# Patient Record
Sex: Female | Born: 2005
Health system: Southern US, Community
[De-identification: ages and names within clinical notes are randomized; demographics above are authoritative.]

---
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
## 2023-06-18 ENCOUNTER — Other Ambulatory Visit (HOSPITAL_COMMUNITY)
Admission: RE | Admit: 2023-06-18 | Discharge: 2023-06-18 | Disposition: A | Discharge status: Home / Self Care | Payer: Medicaid Other | Source: Ambulatory Visit

## 2023-06-18 ENCOUNTER — Ambulatory Visit (INDEPENDENT_AMBULATORY_CARE_PROVIDER_SITE_OTHER): Payer: BC Managed Care – PPO

## 2023-06-18 VITALS — BP 107/64 | HR 67 | Ht 69.0 in | Wt 129.0 lb

## 2023-06-18 DIAGNOSIS — Z113 Encounter for screening for infections with a predominantly sexual mode of transmission: Secondary | ICD-10-CM | POA: Diagnosis present

## 2023-06-18 DIAGNOSIS — A749 Chlamydial infection, unspecified: Secondary | ICD-10-CM

## 2023-06-18 DIAGNOSIS — Z30430 Encounter for insertion of intrauterine contraceptive device: Secondary | ICD-10-CM

## 2023-06-18 DIAGNOSIS — Z3202 Encounter for pregnancy test, result negative: Secondary | ICD-10-CM | POA: Diagnosis not present

## 2023-06-18 MED ADMIN — Levonorgestrel IUD 20 MCG/DAY (Initial) (52 MG Total): 1 | INTRAUTERINE | NDC 50419042301

## 2023-06-19 ENCOUNTER — Encounter (HOSPITAL_BASED_OUTPATIENT_CLINIC_OR_DEPARTMENT_OTHER): Payer: Self-pay | Admitting: Urology

## 2023-06-19 ENCOUNTER — Other Ambulatory Visit: Payer: Self-pay

## 2023-06-19 ENCOUNTER — Telehealth: Payer: Self-pay

## 2023-06-19 MED ORDER — IBUPROFEN 600 MG PO TABS: 600.0000 mg | ORAL_TABLET | Freq: Four times a day (QID) | ORAL | 0 refills | Status: DC | PRN

## 2023-06-19 NOTE — Patient Instructions (Signed)

## 2023-06-19 NOTE — Telephone Encounter (Signed)
Spoke with patients mother to check in on patient after IUD placement on 06/18/2023. Patients mother reports patient is doing well.

## 2023-06-20 ENCOUNTER — Ambulatory Visit: Payer: BC Managed Care – PPO

## 2023-06-20 ENCOUNTER — Telehealth: Payer: Self-pay

## 2023-06-20 LAB — CERVICOVAGINAL ANCILLARY ONLY
Chlamydia: POSITIVE — AB
Comment: NEGATIVE
Comment: NEGATIVE
Comment: NORMAL
Neisseria Gonorrhea: NEGATIVE
Trichomonas: NEGATIVE

## 2023-06-20 MED ORDER — DOXYCYCLINE HYCLATE 100 MG PO TABS: 100.0000 mg | ORAL_TABLET | Freq: Two times a day (BID) | ORAL | 0 refills | Status: DC

## 2023-06-20 NOTE — Addendum Note (Signed)
Addended by: Gerrit Heck L on: 06/20/2023 04:25 PM   Modules accepted: Orders

## 2023-06-20 NOTE — Telephone Encounter (Signed)
I connected with  Rhonda Morris on 06/20/23 by telephone and verified that I am speaking with the correct person using two identifiers.   Patient informed of positive Chlamydia results noted on cervicovaginal ancillary swab. Patient informed medication has been sent to pharmacy for pickup. Patient advised to inform partner(s) so partner(s) can be tested and treated at PCP or the health department. Patient advised to abstain from any intercourse until all parties have received appropriate treatment. Patient encouraged to come into the office for serum STD panel. Appt. Scheduled for Monday 07/01/2023 at 10:30am. Patient informed a test of cure can be complete on 11/26 at string check appt. Patient has no further questions at this time.

## 2023-06-20 NOTE — Telephone Encounter (Signed)
-----   Message from Cherre Robins sent at 06/20/2023  4:25 PM EDT ----- Attempted to call patient. No answer, voicemail nondescript so no message left. Please call patient tomorrow and inform of results.  Treatment sent to pharmacy on file.

## 2023-06-24 ENCOUNTER — Other Ambulatory Visit (INDEPENDENT_AMBULATORY_CARE_PROVIDER_SITE_OTHER): Payer: BC Managed Care – PPO

## 2023-06-24 DIAGNOSIS — Z113 Encounter for screening for infections with a predominantly sexual mode of transmission: Secondary | ICD-10-CM | POA: Diagnosis not present

## 2023-06-24 NOTE — Progress Notes (Signed)
Patient here STI serum screening. Lab slip given to patient.

## 2023-06-25 LAB — HIV ANTIBODY (ROUTINE TESTING W REFLEX): HIV Screen 4th Generation wRfx: NONREACTIVE

## 2023-06-25 LAB — HEPATITIS B SURFACE ANTIGEN: Hepatitis B Surface Ag: NEGATIVE

## 2023-06-25 LAB — HEPATITIS C ANTIBODY: Hep C Virus Ab: NONREACTIVE

## 2023-06-25 LAB — RPR: RPR Ser Ql: NONREACTIVE

## 2023-08-06 ENCOUNTER — Ambulatory Visit: Payer: Medicaid Other

## 2023-08-20 ENCOUNTER — Other Ambulatory Visit (HOSPITAL_COMMUNITY)
Admission: RE | Admit: 2023-08-20 | Discharge: 2023-08-20 | Disposition: A | Discharge status: Home / Self Care | Payer: BC Managed Care – PPO | Source: Ambulatory Visit

## 2023-08-20 ENCOUNTER — Ambulatory Visit (INDEPENDENT_AMBULATORY_CARE_PROVIDER_SITE_OTHER): Payer: BC Managed Care – PPO

## 2023-08-20 VITALS — BP 128/84 | HR 86 | Ht 69.0 in | Wt 131.0 lb

## 2023-08-20 DIAGNOSIS — Z30431 Encounter for routine checking of intrauterine contraceptive device: Secondary | ICD-10-CM

## 2023-08-20 DIAGNOSIS — A749 Chlamydial infection, unspecified: Secondary | ICD-10-CM | POA: Insufficient documentation

## 2023-08-20 DIAGNOSIS — Z113 Encounter for screening for infections with a predominantly sexual mode of transmission: Secondary | ICD-10-CM

## 2023-08-20 DIAGNOSIS — N898 Other specified noninflammatory disorders of vagina: Secondary | ICD-10-CM | POA: Diagnosis not present

## 2023-08-20 NOTE — Progress Notes (Signed)
    GYNECOLOGY OFFICE ENCOUNTER NOTE-STRING CHECK  History:  Rhonda Morris is a 16 y.o.  here today for today for IUD string check; Mirena  IUD was placed  06/18/2023. No complaints about the IUD, no concerning side effects. She reports having menstrual cycle that lasted 5 days, but cramping has improved. She also requires TOC for CT that was positive.  She reports partner was treated as well.   The following portions of the patient's history were reviewed and updated as appropriate: allergies, current medications, past family history, past medical history, past social history, past surgical history and problem list. No pap smear on file d/t age.  Review of Systems:  Pertinent items are noted in HPI.   Objective:  Physical Exam Blood pressure 128/84, pulse 86, height 5\' 9"  (1.753 m), weight 131 lb (59.4 kg). CONSTITUTIONAL: Well-developed, well-nourished female in no acute distress.  NEUROLOGIC: Alert and oriented to person, place, and time. Normal reflexes, muscle tone coordination.  PSYCHIATRIC: Normal mood and affect. Normal behavior. Normal judgment and thought content. CARDIOVASCULAR: Normal heart rate noted RESPIRATORY: Effort and breath sounds normal, no problems with respiration noted ABDOMEN: Soft, no distention noted.   PELVIC: Normal appearing external genitalia; normal appearing vaginal mucosa and cervix.  IUD strings visualized, about 2 cm in length outside cervix. Done in the presence of a chaperone-Lindsey, RN.   Assessment & Plan:  String Check TOC  -Reassured that strings noted. -Discussed checking with heavy bleeding or painful cramping. -TOC collected. Will add on yeast and BV as patient with copious amt yellowish discharge. -RTO prn  Cherre Robins MSN, CNM Advanced Practice Provider, [facility]

## 2023-08-22 LAB — CERVICOVAGINAL ANCILLARY ONLY
Bacterial Vaginitis (gardnerella): NEGATIVE
Candida Glabrata: NEGATIVE
Candida Vaginitis: NEGATIVE
Chlamydia: POSITIVE — AB
Comment: NEGATIVE
Comment: NEGATIVE
Comment: NEGATIVE
Comment: NEGATIVE
Comment: NEGATIVE
Comment: NORMAL
Neisseria Gonorrhea: POSITIVE — AB
Trichomonas: NEGATIVE

## 2023-08-23 ENCOUNTER — Telehealth: Payer: Self-pay

## 2023-08-23 MED ORDER — DOXYCYCLINE HYCLATE 100 MG PO TABS: 100.0000 mg | ORAL_TABLET | Freq: Two times a day (BID) | ORAL | 0 refills | Status: DC

## 2023-08-23 NOTE — Addendum Note (Signed)
Addended by: Gerrit Heck L on: 08/23/2023 10:22 AM   Modules accepted: Orders

## 2023-08-23 NOTE — Telephone Encounter (Signed)
-----   Message from Cherre Robins sent at 08/23/2023 10:22 AM EST ----- Please schedule patient for treatment/injection. Rx sent to pharmacy.

## 2023-08-23 NOTE — Telephone Encounter (Signed)
Called patient to inform her that she tested positive for Gonorrhea and Chlamydia and to schedule her for a Rocephin injection. Left message for patient to call the office back. Sheera Illingworth l Kaiden Dardis, CMA

## 2023-08-30 ENCOUNTER — Ambulatory Visit (INDEPENDENT_AMBULATORY_CARE_PROVIDER_SITE_OTHER): Payer: BC Managed Care – PPO

## 2023-08-30 VITALS — BP 107/76 | HR 63 | Wt 133.0 lb

## 2023-08-30 DIAGNOSIS — A549 Gonococcal infection, unspecified: Secondary | ICD-10-CM

## 2023-08-30 MED ORDER — CEFTRIAXONE SODIUM 500 MG IJ SOLR
500.0000 mg | Freq: Once | INTRAMUSCULAR | Status: AC
  Administered 2023-08-30: 500 mg via INTRAMUSCULAR

## 2023-08-30 NOTE — Progress Notes (Signed)
Rhonda Morris here for Rocephin  Injection.  Injection administered without complication. Patient will return in  as needed  for next injection.  Rhonda Morris l Rhonda Morris, CMA 08/30/2023  9:56 AM

## 2023-10-08 DIAGNOSIS — R6883 Chills (without fever): Secondary | ICD-10-CM | POA: Diagnosis not present

## 2023-10-08 DIAGNOSIS — R059 Cough, unspecified: Secondary | ICD-10-CM | POA: Diagnosis not present

## 2023-10-08 DIAGNOSIS — U071 COVID-19: Secondary | ICD-10-CM | POA: Diagnosis not present

## 2024-02-06 ENCOUNTER — Other Ambulatory Visit (HOSPITAL_COMMUNITY)
Admission: RE | Admit: 2024-02-06 | Discharge: 2024-02-06 | Disposition: A | Discharge status: Home / Self Care | Source: Ambulatory Visit | Attending: Physician Assistant | Admitting: Physician Assistant

## 2024-02-06 ENCOUNTER — Encounter: Payer: Self-pay | Admitting: Physician Assistant

## 2024-02-06 ENCOUNTER — Ambulatory Visit (INDEPENDENT_AMBULATORY_CARE_PROVIDER_SITE_OTHER): Admitting: Physician Assistant

## 2024-02-06 VITALS — BP 104/65 | HR 75 | Temp 98.7°F | Resp 16 | Ht 69.0 in | Wt 144.0 lb

## 2024-02-06 DIAGNOSIS — K625 Hemorrhage of anus and rectum: Secondary | ICD-10-CM | POA: Diagnosis not present

## 2024-02-06 DIAGNOSIS — Z113 Encounter for screening for infections with a predominantly sexual mode of transmission: Secondary | ICD-10-CM | POA: Diagnosis not present

## 2024-02-06 DIAGNOSIS — H6123 Impacted cerumen, bilateral: Secondary | ICD-10-CM | POA: Diagnosis not present

## 2024-02-06 NOTE — Progress Notes (Unsigned)
 New patient visit   Patient: Rhonda Morris   DOB: [birth year]   18 y.o. Female  MRN: [id number] Visit Date: 02/06/2024  Today's healthcare provider: Trenton Frock, PA-C   Chief Complaint  Patient presents with   New Patient (Initial Visit)    Here to establish care   Subjective    Rhonda Morris is a 18 y.o. female who presents today as a new patient to establish care.   Ears, left ear more clogged, both blocked up .  2 years, blood in stool on toilet paper and in stool, sometimes lower abdominal pain and hurts return, was attriubted to consipation, once daily.    Eats fiber, miralax, tried stool softener ; no weight   Mirena , iud  No LMP recorded. (Menstrual status: IUD).  Random spotting;   History reviewed. No pertinent past medical history. History reviewed. No pertinent surgical history. Family Status  Relation Name Status   Mother  Alive   Father  Alive   Sister  Alive   Sister  Alive   Sister  Alive   Brother  Alive   Brother  Alive   MGM  Alive   MGF  Alive   PGM  Alive   PGF  Alive  No partnership data on file   Family History  Problem Relation Age of Onset   Hypertension Maternal Grandmother    Diabetes Maternal Grandmother    Social History   Socioeconomic History   Marital status: Single    Spouse name: Not on file   Number of children: Not on file   Years of education: Not on file   Highest education level: Not on file  Occupational History   Not on file  Tobacco Use   Smoking status: Never   Smokeless tobacco: Never  Vaping Use   Vaping status: Former  Substance and Sexual Activity   Alcohol use: Not Currently   Drug use: Not Currently   Sexual activity: Yes    Birth control/protection: I.U.D.  Other Topics Concern   Not on file  Social History Narrative   ** Merged History Encounter **       Social Drivers of Corporate investment banker Strain: Not on file  Food Insecurity: Not on file  Transportation Needs: Not on  file  Physical Activity: Not on file  Stress: Not on file  Social Connections: Not on file   Outpatient Medications Prior to Visit  Medication Sig   [DISCONTINUED] doxycycline  (VIBRA -TABS) 100 MG tablet Take 1 tablet (100 mg total) by mouth 2 (two) times daily.   [DISCONTINUED] fluconazole  (DIFLUCAN ) 150 MG tablet Take 1 tablet as needed for vaginal yeast infection. (Patient not taking: Reported on 08/30/2023)   [DISCONTINUED] ibuprofen  (ADVIL ) 600 MG tablet Take 1 tablet (600 mg total) by mouth every 6 (six) hours as needed for mild pain or moderate pain. (Patient not taking: Reported on 08/30/2023)   [DISCONTINUED] ondansetron  (ZOFRAN -ODT) 4 MG disintegrating tablet Take 1 tablet (4 mg total) by mouth every 8 (eight) hours as needed for nausea or vomiting. (Patient not taking: Reported on 08/30/2023)   No facility-administered medications prior to visit.   No Known Allergies  Immunization History  Administered Date(s) Administered   DTaP 06/06/2009   DTaP / Hep B / IPV 11/02/2004, 12/15/2004, 02/27/2005   HIB, Unspecified 11/02/2004, 12/15/2004, 03/07/2006   HPV 9-valent 05/31/2017, 07/25/2018   Hep B, Unspecified 2004-04-11, 02/27/2005   Hepatitis A, Ped/Adol-2 Dose 07/12/2008, 05/29/2010   IPV 06/06/2009  Influenza Nasal 06/06/2009, 05/29/2010   Influenza, Seasonal, Injecte, Preservative Fre 07/12/2008   Influenza,Quad,Nasal, Live 06/17/2014   MMR 06/06/2009, 05/29/2010   MenQuadfi_Meningococcal Groups ACYW Conjugate 12/08/2021   Meningococcal Conjugate 05/31/2017   Pneumococcal Conjugate PCV 7 11/02/2004, 12/15/2004, 02/27/2005   Tdap 05/31/2017   Varicella 06/06/2009, 05/29/2010    Health Maintenance  Topic Date Due   Meningococcal B Vaccine (1 of 2 - Standard) Never done   COVID-19 Vaccine (1 - 2024-25 season) Never done   INFLUENZA VACCINE  04/10/2024   CHLAMYDIA SCREENING  08/19/2024   DTaP/Tdap/Td (6 - Td or Tdap) 06/01/2027   HPV VACCINES  Completed   Hepatitis  C Screening  Completed   HIV Screening  Completed    Patient Care Team: Trenton Frock, PA-C as PCP - General (Physician Assistant)  Review of Systems  {Insert previous labs (optional):23779} {See past labs  Heme  Chem  Endocrine  Serology  Results Review (optional):1}   Objective    BP 104/65 (BP Location: Right Arm, Patient Position: Sitting, Cuff Size: Small)   Pulse 75   Temp 98.7 F (37.1 C) (Oral)   Resp 16   Ht 5\' 9"  (1.753 m)   Wt 144 lb (65.3 kg)   SpO2 98%   BMI 21.27 kg/m  {Insert last BP/Wt (optional):23777}{See vitals history (optional):1}   Physical Exam Constitutional:      General: She is awake.     Appearance: She is well-developed.  HENT:     Head: Normocephalic.     Right Ear: There is impacted cerumen.     Left Ear: There is impacted cerumen.     Ears:     Comments: Post flushing, both EAC clear with normal appearing TM Eyes:     Conjunctiva/sclera: Conjunctivae normal.  Cardiovascular:     Rate and Rhythm: Normal rate and regular rhythm.     Heart sounds: Normal heart sounds.  Pulmonary:     Effort: Pulmonary effort is normal.     Breath sounds: Normal breath sounds.  Abdominal:     Palpations: Abdomen is soft.     Tenderness: There is no abdominal tenderness. There is no guarding.  Skin:    General: Skin is warm.  Neurological:     Mental Status: She is alert and oriented to person, place, and time.  Psychiatric:        Attention and Perception: Attention normal.        Mood and Affect: Mood normal.        Speech: Speech normal.        Behavior: Behavior is cooperative.     Depression Screen    02/06/2024    3:28 PM  PHQ 2/9 Scores  PHQ - 2 Score 0   No results found for any visits on 02/06/24.  Assessment & Plan     Routine screening for STI (sexually transmitted infection) -     RPR -     HIV Antibody (routine testing w rflx) -     Hepatitis C antibody -     Cervicovaginal ancillary only  Rectal bleeding -      Ambulatory referral to Gastroenterology -     CBC with Differential/Platelet -     IBC + Ferritin -     C-reactive protein -     Sedimentation rate -     Comprehensive metabolic panel with GFR     No follow-ups on file.      Trenton Frock, PA-C  [facility] [phone] (phone) [phone] (fax)  [facility]

## 2024-02-07 ENCOUNTER — Encounter: Payer: Self-pay | Admitting: Physician Assistant

## 2024-02-07 DIAGNOSIS — K625 Hemorrhage of anus and rectum: Secondary | ICD-10-CM | POA: Insufficient documentation

## 2024-02-07 LAB — IBC + FERRITIN
Ferritin: 8.3 ng/mL — ABNORMAL LOW (ref 10.0–291.0)
Iron: 77 ug/dL (ref 42–145)
Saturation Ratios: 21.7 % (ref 20.0–50.0)
TIBC: 355.6 ug/dL (ref 250.0–450.0)
Transferrin: 254 mg/dL (ref 212.0–360.0)

## 2024-02-07 LAB — COMPREHENSIVE METABOLIC PANEL WITH GFR
ALT: 19 U/L (ref 0–35)
AST: 24 U/L (ref 0–37)
Albumin: 4.4 g/dL (ref 3.5–5.2)
Alkaline Phosphatase: 39 U/L — ABNORMAL LOW (ref 47–119)
BUN: 14 mg/dL (ref 6–23)
CO2: 29 meq/L (ref 19–32)
Calcium: 9.5 mg/dL (ref 8.4–10.5)
Chloride: 104 meq/L (ref 96–112)
Creatinine, Ser: 0.86 mg/dL (ref 0.40–1.20)
GFR: 97.87 mL/min (ref >60.00)
Glucose, Bld: 79 mg/dL (ref 70–99)
Potassium: 4.4 meq/L (ref 3.5–5.1)
Sodium: 139 meq/L (ref 135–145)
Total Bilirubin: 0.5 mg/dL (ref 0.2–1.2)
Total Protein: 7.2 g/dL (ref 6.0–8.3)

## 2024-02-07 LAB — HIV ANTIBODY (ROUTINE TESTING W REFLEX): HIV 1&2 Ab, 4th Generation: NONREACTIVE

## 2024-02-07 LAB — CBC WITH DIFFERENTIAL/PLATELET
Basophils Absolute: 0 10*3/uL (ref 0.0–0.1)
Basophils Relative: 0.9 % (ref 0.0–3.0)
Eosinophils Absolute: 0.1 10*3/uL (ref 0.0–0.7)
Eosinophils Relative: 1.9 % (ref 0.0–5.0)
HCT: 38.2 % (ref 36.0–49.0)
Hemoglobin: 12.8 g/dL (ref 12.0–16.0)
Lymphocytes Relative: 44.8 % (ref 24.0–48.0)
Lymphs Abs: 1.8 10*3/uL (ref 0.7–4.0)
MCHC: 33.5 g/dL (ref 31.0–37.0)
MCV: 94.9 fl (ref 78.0–98.0)
Monocytes Absolute: 0.4 10*3/uL (ref 0.1–1.0)
Monocytes Relative: 9.8 % (ref 3.0–12.0)
Neutro Abs: 1.7 10*3/uL (ref 1.4–7.7)
Neutrophils Relative %: 42.6 % — ABNORMAL LOW (ref 43.0–71.0)
Platelets: 272 10*3/uL (ref 150.0–575.0)
RBC: 4.03 Mil/uL (ref 3.80–5.70)
RDW: 13.3 % (ref 11.4–15.5)
WBC: 4.1 10*3/uL — ABNORMAL LOW (ref 4.5–13.5)

## 2024-02-07 LAB — C-REACTIVE PROTEIN: CRP: <1 mg/dL (ref 0.5–20.0)

## 2024-02-07 LAB — SEDIMENTATION RATE: Sed Rate: 1 mm/h (ref 0–20)

## 2024-02-07 LAB — RPR: RPR Ser Ql: NONREACTIVE

## 2024-02-07 LAB — HEPATITIS C ANTIBODY: Hepatitis C Ab: NONREACTIVE

## 2024-02-07 NOTE — Assessment & Plan Note (Signed)
 Chronic rectal bleeding for two years with bright red blood in stool and on toilet paper. Symptoms include abdominal pain and difficulty with bowel movements despite increased fiber and water intake. Differential diagnosis includes hemorrhoids, but persistent symptoms and abdominal pain suggest further evaluation is needed. - cbc, iron, cmp, crp,esr, - Refer to a gastroenterologist

## 2024-02-10 ENCOUNTER — Ambulatory Visit: Payer: Self-pay | Admitting: Physician Assistant

## 2024-02-10 LAB — CERVICOVAGINAL ANCILLARY ONLY
Chlamydia: NEGATIVE
Comment: NEGATIVE
Comment: NEGATIVE
Comment: NORMAL
Neisseria Gonorrhea: NEGATIVE
Trichomonas: NEGATIVE

## 2024-02-11 ENCOUNTER — Encounter: Payer: Self-pay | Admitting: Physician Assistant

## 2024-02-16 ENCOUNTER — Emergency Department (HOSPITAL_BASED_OUTPATIENT_CLINIC_OR_DEPARTMENT_OTHER)
Admission: EM | Admit: 2024-02-16 | Discharge: 2024-02-16 | Disposition: A | Discharge status: Home / Self Care | Attending: Emergency Medicine | Admitting: Emergency Medicine

## 2024-02-16 ENCOUNTER — Encounter (HOSPITAL_BASED_OUTPATIENT_CLINIC_OR_DEPARTMENT_OTHER): Payer: Self-pay | Admitting: Urology

## 2024-02-16 ENCOUNTER — Other Ambulatory Visit: Payer: Self-pay

## 2024-02-16 DIAGNOSIS — J029 Acute pharyngitis, unspecified: Secondary | ICD-10-CM | POA: Diagnosis not present

## 2024-02-16 DIAGNOSIS — R519 Headache, unspecified: Secondary | ICD-10-CM | POA: Insufficient documentation

## 2024-02-16 DIAGNOSIS — R059 Cough, unspecified: Secondary | ICD-10-CM | POA: Diagnosis not present

## 2024-02-16 DIAGNOSIS — M791 Myalgia, unspecified site: Secondary | ICD-10-CM | POA: Diagnosis not present

## 2024-02-16 LAB — RESP PANEL BY RT-PCR (RSV, FLU A&B, COVID)  RVPGX2
Influenza A by PCR: NEGATIVE
Influenza B by PCR: NEGATIVE
Resp Syncytial Virus by PCR: NEGATIVE
SARS Coronavirus 2 by RT PCR: NEGATIVE

## 2024-02-16 LAB — GROUP A STREP BY PCR: Group A Strep by PCR: NOT DETECTED

## 2024-02-16 MED ORDER — CELECOXIB 200 MG PO CAPS: 200.0000 mg | ORAL_CAPSULE | Freq: Two times a day (BID) | ORAL | 0 refills | Status: DC | PRN

## 2024-02-16 MED ORDER — DEXAMETHASONE 10 MG/ML FOR PEDIATRIC ORAL USE
10.0000 mg | Freq: Once | INTRAMUSCULAR | Status: AC
  Administered 2024-02-16: 10 mg via ORAL
  Filled 2024-02-16: qty 1

## 2024-02-16 MED ORDER — ACETAMINOPHEN 325 MG PO TABS
650.0000 mg | ORAL_TABLET | Freq: Once | ORAL | Status: AC | PRN
  Administered 2024-02-16: 650 mg via ORAL
  Filled 2024-02-16: qty 2

## 2024-02-16 MED ORDER — AMOXICILLIN 500 MG PO CAPS: 500.0000 mg | ORAL_CAPSULE | Freq: Two times a day (BID) | ORAL | 0 refills | Status: DC

## 2024-02-16 NOTE — ED Notes (Signed)
 Discharge instructions reviewed with patient. Patient verbalizes understanding, no further questions at this time. Medications/prescriptions and follow up information provided. No acute distress noted at time of departure.

## 2024-02-16 NOTE — ED Provider Notes (Signed)
 [facility] Provider Note   CSN: [id number] Arrival date & time: 02/16/24  1622     History  Chief Complaint  Patient presents with   Flu like symptoms    Rhonda Morris is a 18 y.o. female.  HPI   18 year old female presents emergency department with complaints of body ache, sore throat, headache.  Symptom onset this morning.  Patient recently got back from the beach with no known sick exposure.  Has tried no medication for her symptoms.  Denies any difficulty breathing or swallowing but does have pain with swallowing.  Denies any cough, congestion, abdominal pain, vomiting.  Patient states she is not concerned about STDs in the mouth.  No significant pertinent past medical history.  Home Medications Prior to Admission medications   Not on File      Allergies    Patient has no known allergies.    Review of Systems   Review of Systems  All other systems reviewed and are negative.   Physical Exam Updated Vital Signs BP 118/64   Pulse 91   Temp 100 F (37.8 C) (Oral)   Resp 18   Ht 5\' 9"  (1.753 m)   Wt 65.3 kg   SpO2 100%   BMI 21.26 kg/m  Physical Exam Vitals and nursing note reviewed.  Constitutional:      General: She is not in acute distress.    Appearance: She is well-developed.  HENT:     Head: Normocephalic and atraumatic.     Nose: No congestion or rhinorrhea.     Mouth/Throat:     Comments: Tonsils 2+ bilaterally with erythema as well as exudate present.  No sublingual or submandibular swelling.  No trismus.  Uvula midline rises symmetric with phonation. Eyes:     Conjunctiva/sclera: Conjunctivae normal.  Cardiovascular:     Rate and Rhythm: Normal rate and regular rhythm.     Heart sounds: No murmur heard. Pulmonary:     Effort: Pulmonary effort is normal. No respiratory distress.     Breath sounds: Normal breath sounds. No wheezing, rhonchi or rales.  Abdominal:     Palpations: Abdomen is soft.      Tenderness: There is no abdominal tenderness. There is no guarding.  Musculoskeletal:        General: No swelling.     Cervical back: Normal range of motion and neck supple. No rigidity or tenderness.  Skin:    General: Skin is warm and dry.     Capillary Refill: Capillary refill takes less than 2 seconds.  Neurological:     Mental Status: She is alert.     Comments: Alert and oriented to self, place, time and event.   Speech is fluent, clear without dysarthria or dysphasia.   Strength 5/5 in upper/lower extremities   Sensation intact in upper/lower extremities   Normal gait.  CN I not tested  CN II not tested CN III, IV, VI PERRLA and EOMs intact bilaterally  CN V Intact sensation to sharp and light touch to the face  CN VII facial movements symmetric  CN VIII not tested  CN IX, X no uvula deviation, symmetric rise of soft palate  CN XI 5/5 SCM and trapezius strength bilaterally  CN XII Midline tongue protrusion, symmetric L/R movements     Psychiatric:        Mood and Affect: Mood normal.     ED Results / Procedures / Treatments   Labs (all labs  ordered are listed, but only abnormal results are displayed) Labs Reviewed  GROUP A STREP BY PCR  RESP PANEL BY RT-PCR (RSV, FLU A&B, COVID)  [id number]    EKG None  Radiology No results found.  Procedures Procedures    Medications Ordered in ED Medications  acetaminophen  (TYLENOL ) tablet 650 mg (650 mg Oral Given 02/16/24 1637)    ED Course/ Medical Decision Making/ A&P                                 Medical Decision Making Risk OTC drugs.   This patient presents to the ED for concern of cough, body aches, chills, sore throat, this involves an extensive number of treatment options, and is a complaint that carries with it a high risk of complications and morbidity.  The differential diagnosis includes COVID, flu, RSV, pneumonia, bronchitis, strep, other   Co morbidities that complicate the patient  evaluation  See HPI   Additional history obtained:  Additional history obtained from EMR External records from outside source obtained and reviewed including hospital records   Lab Tests:  I Ordered, and personally interpreted labs.  The pertinent results include: Group A strep negative.  Respiratory viral panel negative   Imaging Studies ordered:  N/a   Cardiac Monitoring: / EKG:  N/a   Consultations Obtained:  N/a   Problem List / ED Course / Critical interventions / Medication management  Cough, body aches, sore throat I ordered medication including Tylenol , Decadron   Reevaluation of the patient after these medicines showed that the patient improved I have reviewed the patients home medicines and have made adjustments as needed   Social Determinants of Health:  Denies tobacco, licit drug use.   Test / Admission - Considered:  Cough, body aches, sore throat Vitals signs within normal range and stable throughout visit. Laboratory studies significant for: See above 18 year old female presents emergency department with complaints of body ache, sore throat, headache.  Symptom onset this morning.  Patient recently got back from the beach with no known sick exposure.  Has tried no medication for her symptoms.  Denies any difficulty breathing or swallowing but does have pain with swallowing.  Denies any cough, congestion, abdominal pain, vomiting.  Patient states she is not concerned about STDs in the mouth. On exam, lungs clear to station bilaterally.  No abdominal tenderness.  Patient with bilateral tonsillar enlargement with exudate present.  No acute respiratory distress or any clinical evidence of Ludwig angina, PTA.  Group A strep test was negative.  Viral testing was negative.  Offered mononucleosis as well as GC/chlamydia swab which was declined.  Suspect patient's symptoms likely secondary to viral URI.  Recommend symptomatic therapy as described in AVS with  close follow-up with PCP in the outpatient setting.  Treatment plan discussed with patient and she acknowledged understanding was agreeable to said plan.  Patient overall well-appearing, afebrile in no acute distress. Worrisome signs and symptoms were discussed with the patient, and the patient acknowledged understanding to return to the ED if noticed. Patient was stable upon discharge.          Final Clinical Impression(s) / ED Diagnoses Final diagnoses:  None    Rx / DC Orders ED Discharge Orders     None         Rennerdale Butter, [place] 02/16/24 1753    Dalene Duck, MD 02/16/24 2256

## 2024-02-16 NOTE — ED Triage Notes (Signed)
 Pt states sore throat, chills, body aches, and headache that started after getting home from beach last night

## 2024-02-16 NOTE — Discharge Instructions (Addendum)
 You tested negative for strep as well as COVID flu and RSV.  Most likely cause is viral.  Will send home with antibiotics to take if your symptoms do not get better or worsen over in the next couple of days.  Will send her with anti-inflammatory to take as needed.  Recommend follow-up with your primary care for reassessment.  Please do not hesitate to return to the emergency department if the worrisome signs and symptoms we discussed become apparent.

## 2024-02-27 ENCOUNTER — Encounter: Payer: Self-pay | Admitting: Physician Assistant

## 2024-03-18 ENCOUNTER — Ambulatory Visit: Payer: Self-pay

## 2024-03-18 ENCOUNTER — Encounter: Payer: Self-pay | Admitting: Family Medicine

## 2024-03-18 ENCOUNTER — Ambulatory Visit: Admitting: Family Medicine

## 2024-03-18 VITALS — BP 93/72 | HR 86 | Temp 98.3°F | Ht 69.0 in | Wt 133.0 lb

## 2024-03-18 DIAGNOSIS — J039 Acute tonsillitis, unspecified: Secondary | ICD-10-CM

## 2024-03-18 DIAGNOSIS — J029 Acute pharyngitis, unspecified: Secondary | ICD-10-CM | POA: Diagnosis not present

## 2024-03-18 LAB — POC COVID19 BINAXNOW: SARS Coronavirus 2 Ag: NEGATIVE

## 2024-03-18 LAB — POCT RAPID STREP A (OFFICE): Rapid Strep A Screen: NEGATIVE

## 2024-03-18 MED ORDER — AMOXICILLIN 500 MG PO CAPS: 500.0000 mg | ORAL_CAPSULE | Freq: Two times a day (BID) | ORAL | 0 refills | Status: AC

## 2024-03-18 NOTE — Progress Notes (Signed)
 Acute Office Visit  Subjective:     Patient ID: Rhonda Morris, female    DOB: [birth year], 17 y.o.   MRN: [id number]  Chief Complaint  Patient presents with   Sore Throat     Patient is in today for sore throat.  Discussed the use of AI scribe software for clinical note transcription with the patient, who gave verbal consent to proceed.  History of Present Illness Rhonda Morris is a 18 year old female who presents with a sore throat.  She has been experiencing a sore throat for three days, initially described as a burning sensation, especially when drinking water, leading to reduced intake of food and fluids due to pain severity.  On the second day of symptoms, she attempted to go to work but had to leave early because the pain was too intense. Her mother suggested using salt water gargles, which she performed twice yesterday, resulting in minimal improvement today, although the pain persists.  She describes a sensation of swelling in her throat, noting that her voice sounds different and her tongue feels as though it is touching her tonsils. She observed white patches in her throat during the first two days, which have since improved after using salt water.  No fever during this episode, but she recalls a similar episode about a month ago after a trip to the beach, with body aches, headache, and fever. During that episode, she visited the emergency department, where she was swabbed for various infections, all of which were negative. She received a steroid shot and Tylenol , which improved her symptoms over three days, though they never fully resolved.  No current ear pain, runny nose, coughing, sneezing, or unusual fatigue. She has not been around anyone who is sick recently.       ROS All review of systems negative except what is listed in the HPI      Objective:    BP 93/72   Pulse 86   Temp 98.3 F (36.8 C) (Oral)   Ht 5' 9 (1.753 m)   Wt 133 lb (60.3 kg)    SpO2 100%   BMI 19.64 kg/m    Physical Exam Vitals reviewed.  Constitutional:      General: She is not in acute distress.    Appearance: Normal appearance. She is not ill-appearing.  HENT:     Head: Normocephalic and atraumatic.     Right Ear: There is impacted cerumen.     Left Ear: There is impacted cerumen.     Mouth/Throat:     Pharynx: Uvula midline. Posterior oropharyngeal erythema present.     Tonsils: No tonsillar abscesses. 3+ on the right. 3+ on the left.     Comments: Palatal petechiae; minimal tonsilar exudate Pulmonary:     Effort: No respiratory distress.  Lymphadenopathy:     Cervical: Cervical adenopathy present.  Skin:    General: Skin is warm and dry.  Neurological:     Mental Status: She is alert and oriented to person, place, and time.  Psychiatric:        Mood and Affect: Mood normal.        Behavior: Behavior normal.        Thought Content: Thought content normal.        Judgment: Judgment normal.        Results for orders placed or performed in visit on 03/18/24  POCT rapid strep A  Result Value Ref Range   Rapid Strep A Screen Negative Negative  POC COVID-19 BinaxNow  Result Value Ref Range   SARS Coronavirus 2 Ag Negative Negative        Assessment & Plan:   Problem List Items Addressed This Visit   None Visit Diagnoses       Sore throat    -  Primary   Relevant Orders   POCT rapid strep A (Completed)   POC COVID-19 BinaxNow (Completed)     Tonsillitis       Relevant Medications   amoxicillin  (AMOXIL ) 500 MG capsule      Negative Strep and COVID testing, but presentation suspicious for bacterial infection.  Will go ahead and treat with amoxicillin .  Continue salt water gargles, warm liquids, honey, lemon, tylenol , ibuprofen , etc.  If no significant improvement within the first 2 days of ABX, let me know and we can add some steroids as well.  Patient aware of signs/symptoms requiring further/urgent evaluation.     Meds  ordered this encounter  Medications   amoxicillin  (AMOXIL ) 500 MG capsule    Sig: Take 1 capsule (500 mg total) by mouth 2 (two) times daily for 10 days.    Dispense:  20 capsule    Refill:  0    Supervising Provider:   DOMENICA BLACKBIRD A [[id number]]    Return if symptoms worsen or fail to improve.  Rhonda Morris Mon, NP

## 2024-03-18 NOTE — Telephone Encounter (Signed)
 Appointment made for 03/18/2024 at 1 PM with Waddell Mon NP.  Rhonda Morris Only or Action Required?: FYI only for provider.  Patient was last seen in primary care on 02/06/2024 by Manuelita Flatness PA-C.  Called Nurse Triage reporting Sore Throat.  Symptoms began 2 days ago---but never completely got better from a sore throat a month ago.  Interventions attempted: Rest, hydration, or home remedies.  Symptoms are: gradually worsening.  Triage Disposition: See Physician Within 24 Hours  Patient/caregiver understands and will follow disposition?: Yes                             Copied from CRM [id number]. Topic: Clinical - Red Word Triage >> Mar 18, 2024  9:28 AM Zenovia PARAS wrote: Red Word that prompted transfer to Nurse Triage: Having throat pain Reason for Disposition  SEVERE (e.g., excruciating) throat pain  Answer Assessment - Initial Assessment Questions 1. ONSET: When did the throat start hurting? (Hours or days ago)      2 days ago 2. SEVERITY: How bad is the sore throat? (Scale 1-10; mild, moderate or severe)   - MILD (1-3):  Doesn't interfere with eating or normal activities.   - MODERATE (4-7): Interferes with eating some solids and normal activities.   - SEVERE (8-10):  Excruciating pain, interferes with most normal activities.   - SEVERE WITH DYSPHAGIA (10): Can't swallow liquids, drooling.     8 3. STREP EXPOSURE: Has there been any exposure to strep within the past week? If Yes, ask: What type of contact occurred?      No 4.  VIRAL SYMPTOMS: Are there any symptoms of a cold, such as a runny nose, cough, hoarse voice or red eyes?      No 5. FEVER: Do you have a fever? If Yes, ask: What is your temperature, how was it measured, and when did it start?     No 6. PUS ON THE TONSILS: Is there pus on the tonsils in the back of your throat?     No 7. OTHER SYMPTOMS: Do you have any other symptoms? (e.g., difficulty breathing, headache, rash)      No 8. PREGNANCY: Is there any chance you are pregnant? When was your last menstrual period?     No--LMP 3 weeks ago    Patient states that a similar incident happened a month ago and everything was negative at that time and it never really got better and just got worse a few days ago.  Protocols used: Sore Throat-A-AH

## 2024-04-06 ENCOUNTER — Ambulatory Visit: Admitting: Obstetrics & Gynecology

## 2024-04-21 ENCOUNTER — Ambulatory Visit: Admitting: Physician Assistant

## 2024-04-21 NOTE — Progress Notes (Deleted)
      Established patient visit   Patient: Rhonda Morris   DOB: May 26, 2006   17 y.o. Female  MRN: [id number] Visit Date: 04/21/2024  Today's healthcare provider: Manuelita Flatness, PA-C   No chief complaint on file.  Subjective     ***  Medications: No outpatient medications prior to visit.   No facility-administered medications prior to visit.    Review of Systems {Insert previous labs (optional):23779} {See past labs  Heme  Chem  Endocrine  Serology  Results Review (optional):1}   Objective    There were no vitals taken for this visit. {Insert last BP/Wt (optional):23777}{See vitals history (optional):1}  Physical Exam  ***  No results found for any visits on 04/21/24.  Assessment & Plan    There are no diagnoses linked to this encounter.  ***  No follow-ups on file.       Manuelita Flatness, PA-C  [facility] at [facility] [phone] (phone) [phone] (fax)  [facility]

## 2024-04-22 ENCOUNTER — Other Ambulatory Visit (HOSPITAL_COMMUNITY)
Admission: RE | Admit: 2024-04-22 | Discharge: 2024-04-22 | Disposition: A | Discharge status: Home / Self Care | Source: Ambulatory Visit | Attending: Physician Assistant | Admitting: Physician Assistant

## 2024-04-22 ENCOUNTER — Ambulatory Visit (INDEPENDENT_AMBULATORY_CARE_PROVIDER_SITE_OTHER): Admitting: Physician Assistant

## 2024-04-22 ENCOUNTER — Encounter: Payer: Self-pay | Admitting: Physician Assistant

## 2024-04-22 VITALS — BP 90/44 | HR 56 | Ht 69.0 in | Wt 136.2 lb

## 2024-04-22 DIAGNOSIS — N898 Other specified noninflammatory disorders of vagina: Secondary | ICD-10-CM | POA: Insufficient documentation

## 2024-04-22 NOTE — Progress Notes (Signed)
      Established patient visit   Patient: Rhonda Morris   DOB: [birth year]   17 y.o. Female  MRN: [id number] Visit Date: 04/22/2024  Today's healthcare provider: Manuelita Flatness, PA-C   Cc. Vaginal odor  Subjective    Pt reports for the last few days, abnormal vaginal odor. Denies pain, abnormal bleeding ,discharge, itching. She denies any new sexual partners.   Medications: No outpatient medications prior to visit.   No facility-administered medications prior to visit.    Review of Systems  Constitutional:  Negative for fatigue and fever.  Respiratory:  Negative for cough and shortness of breath.   Cardiovascular:  Negative for chest pain and leg swelling.  Gastrointestinal:  Negative for abdominal pain.  Neurological:  Negative for dizziness and headaches.       Objective    BP (!) 90/44   Pulse (!) 56   Ht 5' 9 (1.753 m)   Wt 136 lb 3.2 oz (61.8 kg)   LMP 03/31/2024   BMI 20.11 kg/m    Physical Exam Vitals reviewed.  Constitutional:      Appearance: She is not ill-appearing.  HENT:     Head: Normocephalic.  Eyes:     Conjunctiva/sclera: Conjunctivae normal.  Cardiovascular:     Rate and Rhythm: Normal rate.  Pulmonary:     Effort: Pulmonary effort is normal. No respiratory distress.  Neurological:     Mental Status: She is alert and oriented to person, place, and time.  Psychiatric:        Mood and Affect: Mood normal.        Behavior: Behavior normal.      No results found for any visits on 04/22/24.  Assessment & Plan    Vaginal odor -     Cervicovaginal ancillary only   R/o STI, bv, yeast  Return if symptoms worsen or fail to improve.       Manuelita Flatness, PA-C  [facility] at [facility] [phone] (phone) [phone] (fax)  [facility]

## 2024-04-23 ENCOUNTER — Ambulatory Visit: Payer: Self-pay | Admitting: Physician Assistant

## 2024-04-23 DIAGNOSIS — B9689 Other specified bacterial agents as the cause of diseases classified elsewhere: Secondary | ICD-10-CM

## 2024-04-23 LAB — CERVICOVAGINAL ANCILLARY ONLY
Bacterial Vaginitis (gardnerella): POSITIVE — AB
Candida Glabrata: NEGATIVE
Candida Vaginitis: NEGATIVE
Chlamydia: NEGATIVE
Comment: NEGATIVE
Comment: NEGATIVE
Comment: NEGATIVE
Comment: NEGATIVE
Comment: NEGATIVE
Comment: NORMAL
Neisseria Gonorrhea: NEGATIVE
Trichomonas: NEGATIVE

## 2024-04-23 MED ORDER — METRONIDAZOLE 500 MG PO TABS: 500.0000 mg | ORAL_TABLET | Freq: Two times a day (BID) | ORAL | 0 refills | Status: AC

## 2024-06-14 ENCOUNTER — Encounter (HOSPITAL_COMMUNITY): Payer: Self-pay

## 2024-06-14 ENCOUNTER — Emergency Department (HOSPITAL_COMMUNITY)
Admission: EM | Admit: 2024-06-14 | Discharge: 2024-06-14 | Disposition: A | Discharge status: Home / Self Care | Attending: Emergency Medicine | Admitting: Emergency Medicine

## 2024-06-14 ENCOUNTER — Other Ambulatory Visit: Payer: Self-pay

## 2024-06-14 DIAGNOSIS — R112 Nausea with vomiting, unspecified: Secondary | ICD-10-CM | POA: Diagnosis not present

## 2024-06-14 DIAGNOSIS — R109 Unspecified abdominal pain: Secondary | ICD-10-CM | POA: Insufficient documentation

## 2024-06-14 LAB — URINALYSIS, ROUTINE W REFLEX MICROSCOPIC
Bilirubin Urine: NEGATIVE
Glucose, UA: NEGATIVE mg/dL
Hgb urine dipstick: NEGATIVE
Ketones, ur: 5 mg/dL — AB
Leukocytes,Ua: NEGATIVE
Nitrite: NEGATIVE
Protein, ur: NEGATIVE mg/dL
Specific Gravity, Urine: 1.02 (ref 1.005–1.030)
pH: 8 (ref 5.0–8.0)

## 2024-06-14 LAB — CBC WITH DIFFERENTIAL/PLATELET
Abs Immature Granulocytes: 0.01 K/uL (ref 0.00–0.07)
Basophils Absolute: 0 K/uL (ref 0.0–0.1)
Basophils Relative: 0 %
Eosinophils Absolute: 0.1 K/uL (ref 0.0–0.5)
Eosinophils Relative: 1 %
HCT: 39.3 % (ref 36.0–46.0)
Hemoglobin: 12.8 g/dL (ref 12.0–15.0)
Immature Granulocytes: 0 %
Lymphocytes Relative: 10 %
Lymphs Abs: 0.7 K/uL (ref 0.7–4.0)
MCH: 31.2 pg (ref 26.0–34.0)
MCHC: 32.6 g/dL (ref 30.0–36.0)
MCV: 95.9 fL (ref 80.0–100.0)
Monocytes Absolute: 0.6 K/uL (ref 0.1–1.0)
Monocytes Relative: 10 %
Neutro Abs: 4.9 K/uL (ref 1.7–7.7)
Neutrophils Relative %: 79 %
Platelets: 234 K/uL (ref 150–400)
RBC: 4.1 MIL/uL (ref 3.87–5.11)
RDW: 12.3 % (ref 11.5–15.5)
WBC: 6.3 K/uL (ref 4.0–10.5)
nRBC: 0 % (ref 0.0–0.2)

## 2024-06-14 LAB — COMPREHENSIVE METABOLIC PANEL WITH GFR
ALT: 14 U/L (ref 0–44)
AST: 20 U/L (ref 15–41)
Albumin: 4.2 g/dL (ref 3.5–5.0)
Alkaline Phosphatase: 45 U/L (ref 38–126)
Anion gap: 9 (ref 5–15)
BUN: 9 mg/dL (ref 6–20)
CO2: 22 mmol/L (ref 22–32)
Calcium: 9.3 mg/dL (ref 8.9–10.3)
Chloride: 106 mmol/L (ref 98–111)
Creatinine, Ser: 0.72 mg/dL (ref 0.44–1.00)
GFR, Estimated: >60 mL/min (ref >60)
Glucose, Bld: 88 mg/dL (ref 70–99)
Potassium: 3.6 mmol/L (ref 3.5–5.1)
Sodium: 137 mmol/L (ref 135–145)
Total Bilirubin: 1 mg/dL (ref 0.0–1.2)
Total Protein: 6.7 g/dL (ref 6.5–8.1)

## 2024-06-14 LAB — HCG, SERUM, QUALITATIVE: Preg, Serum: NEGATIVE

## 2024-06-14 LAB — LIPASE, BLOOD: Lipase: 12 U/L (ref 11–51)

## 2024-06-14 MED ORDER — SODIUM CHLORIDE 0.9 % IV BOLUS
1000.0000 mL | Freq: Once | INTRAVENOUS | Status: AC
  Administered 2024-06-14: 1000 mL via INTRAVENOUS

## 2024-06-14 MED ORDER — ONDANSETRON 4 MG PO TBDP: ORAL_TABLET | ORAL | 0 refills | Status: AC

## 2024-06-14 MED ORDER — ONDANSETRON HCL 4 MG/2ML IJ SOLN
4.0000 mg | Freq: Once | INTRAMUSCULAR | Status: AC
  Administered 2024-06-14: 4 mg via INTRAVENOUS
  Filled 2024-06-14: qty 2

## 2024-06-14 NOTE — Discharge Instructions (Signed)
 As we discussed you likely have a stomach virus  Please take Zofran  for nausea  Please stay hydrated  See your doctor for follow-up  Return to ER if you have worse vomiting or abdominal pain or dehydration

## 2024-06-14 NOTE — ED Triage Notes (Signed)
 Pt arrives EMS with reports of emesis x1 today. Pt denies pain in abdomen. Pt denies other symptoms or sick contacts. Pt was given 4mg  zofran  with EMS

## 2024-06-14 NOTE — ED Provider Notes (Signed)
 [place] EMERGENCY DEPARTMENT AT [facility] Provider Note   CSN: [id number] Arrival date & time: 06/14/24  2034     Patient presents with: Emesis   Rhonda Morris is a 18 y.o. female here presenting with abdominal pain and vomiting.  Patient states that she had acute onset of vomiting.  She denies any abdominal pain.  Denies eating any uncooked meat or travel.  Denies any sick contacts.   The history is provided by the patient.       Prior to Admission medications   Not on File    Allergies: Patient has no known allergies.    Review of Systems  Gastrointestinal:  Positive for vomiting.  All other systems reviewed and are negative.   Updated Vital Signs BP 109/62   Pulse 77   Temp 99.4 F (37.4 C)   Resp 16   LMP 06/02/2024 (Approximate)   SpO2 100%   Physical Exam Vitals and nursing note reviewed.  HENT:     Head: Normocephalic.     Nose: Nose normal.     Mouth/Throat:     Mouth: Mucous membranes are dry.  Eyes:     Extraocular Movements: Extraocular movements intact.     Pupils: Pupils are equal, round, and reactive to light.  Cardiovascular:     Rate and Rhythm: Normal rate and regular rhythm.     Pulses: Normal pulses.     Heart sounds: Normal heart sounds.  Pulmonary:     Effort: Pulmonary effort is normal.     Breath sounds: Normal breath sounds.  Abdominal:     General: Abdomen is flat.     Palpations: Abdomen is soft.  Musculoskeletal:        General: Normal range of motion.     Cervical back: Normal range of motion and neck supple.  Skin:    General: Skin is warm.     Capillary Refill: Capillary refill takes less than 2 seconds.  Neurological:     General: No focal deficit present.     Mental Status: She is alert and oriented to person, place, and time.  Psychiatric:        Mood and Affect: Mood normal.        Behavior: Behavior normal.     (all labs ordered are listed, but only abnormal results are displayed) Labs  Reviewed  CBC WITH DIFFERENTIAL/PLATELET  HCG, SERUM, QUALITATIVE  COMPREHENSIVE METABOLIC PANEL WITH GFR  LIPASE, BLOOD  URINALYSIS, ROUTINE W REFLEX MICROSCOPIC    EKG: None  Radiology: No results found.   Procedures   Medications Ordered in the ED  sodium chloride 0.9 % bolus 1,000 mL (1,000 mLs Intravenous New Bag/Given 06/14/24 2054)  ondansetron  (ZOFRAN ) injection 4 mg (4 mg Intravenous Given 06/14/24 2053)                                    Medical Decision Making Rhonda Morris is a 18 y.o. female presenting with vomiting.  Likely viral gastroenteritis.  Abdomen is nontender.  Plan to get CBC and CMP and lipase and hydrate and reassess  10:37 PM Patient's labs and they were unremarkable.  Patient is able to tolerate p.o.  Stable for discharge.  Likely viral gastroenteritis  Problems Addressed: Nausea and vomiting, unspecified vomiting type: acute illness or injury  Amount and/or Complexity of Data Reviewed Labs: ordered.  Risk Prescription drug management.  Final diagnoses:  None    ED Discharge Orders     None          Rhonda Alm Macho, MD 06/14/24 2238

## 2024-06-26 ENCOUNTER — Other Ambulatory Visit (HOSPITAL_COMMUNITY)
Admission: RE | Admit: 2024-06-26 | Discharge: 2024-06-26 | Disposition: A | Discharge status: Home / Self Care | Source: Ambulatory Visit

## 2024-06-26 ENCOUNTER — Ambulatory Visit (INDEPENDENT_AMBULATORY_CARE_PROVIDER_SITE_OTHER)

## 2024-06-26 VITALS — BP 124/86 | HR 112 | Ht 69.0 in | Wt 127.0 lb

## 2024-06-26 DIAGNOSIS — Z113 Encounter for screening for infections with a predominantly sexual mode of transmission: Secondary | ICD-10-CM | POA: Diagnosis not present

## 2024-06-26 DIAGNOSIS — N750 Cyst of Bartholin's gland: Secondary | ICD-10-CM | POA: Insufficient documentation

## 2024-06-26 DIAGNOSIS — Z7689 Persons encountering health services in other specified circumstances: Secondary | ICD-10-CM

## 2024-06-26 MED ORDER — IBUPROFEN 600 MG PO TABS: 600.0000 mg | ORAL_TABLET | Freq: Four times a day (QID) | ORAL | 3 refills | Status: AC | PRN

## 2024-06-26 MED ORDER — AMOXICILLIN-POT CLAVULANATE 875-125 MG PO TABS: 1.0000 | ORAL_TABLET | Freq: Two times a day (BID) | ORAL | 0 refills | Status: AC

## 2024-06-26 NOTE — Progress Notes (Unsigned)
 GYNECOLOGY PROBLEM OFFICE VISIT NOTE  History:  Rhonda Morris is a 18 y.o. AA female here today for vaginal discomfort.  She states she is having vaginal swelling and hardness.   She states her symptoms started earlier this week along with cloudy discharge and fishy odor. She reports sexual activity last week.    She reports h/o STD and charts shows GC/CT in Dec 2024.   She reports LMP was 9/23 and was normal.  She denies any abnormal bleeding, pelvic pain or issues with urination, constipation, or diarrhea.   No past medical history on file.  No past surgical history on file.  The following portions of the patient's history were reviewed and updated as appropriate: allergies, current medications, past family history, past medical history, past social history, past surgical history and problem list.   Health Maintenance:  Patient's last menstrual period was 06/02/2024 (exact date). Normal pap or mammogram on file d/t age.   Review of Systems:  Breasts: Negative. Genito-Urinary ROS: negative Gastrointestinal ROS: negative Objective:  Vitals: BP 124/86 (BP Location: Left Arm, Patient Position: Standing, Cuff Size: Normal)   Pulse (!) 112   Ht 5' 9 (1.753 m)   Wt 127 lb 0.6 oz (57.6 kg)   LMP 06/02/2024 (Exact Date)   BMI 18.76 kg/m   Physical Exam: Physical Exam Constitutional:      General: She is in acute distress.     Appearance: Normal appearance. She is not toxic-appearing.  Genitourinary:     Right Labia: No tenderness or Bartholin's cyst (5x5. No induration, erythema, or active leaking noted).    Left Labia: tenderness and Bartholin's cyst.   HENT:     Head: Normocephalic and atraumatic.  Eyes:     Conjunctiva/sclera: Conjunctivae normal.  Cardiovascular:     Rate and Rhythm: Normal rate.  Pulmonary:     Effort: Pulmonary effort is normal.  Musculoskeletal:        General: Normal range of motion.     Cervical back: Normal range of motion.  Neurological:      Mental Status: She is alert and oriented to person, place, and time.  Skin:    General: Skin is warm and dry.  Psychiatric:        Mood and Affect: Mood normal.        Behavior: Behavior normal.  Vitals reviewed. Exam conducted with a chaperone present [place], MA).   Procedure: Incision & Drainage  Location: Bartholin Gland  Informed Consent:  Reviewed procedure including potential complications and possible outcomes of procedure including recurrence of cyst, scarring, infection, bleeding, dyspareunia, and/or distortion of anatomy.  Patient verbalizes understanding and written informed consent was obtained.   Procedure Note Patient was examined in the dorsal lithotomy position and left sided mass >3 cm was identified in front of the hymenal ring.  The area was tender to touch and was not actively draining. The area was prepped with Iodine and draped in a sterile manner. 1% Lidocaine (3 ml) was then used to infiltrate area on top of the cyst, behind the hymenal ring.  A 2 mm incision was made using a sterile scapel. Upon palpation of the mass, a moderate amount of pus and blood drainage was expressed through the incision. A hemostat was used to break up loculations, which resulted in expression of more bloody purulent drainage.  Cultures were not sent. The word catheter was then placed within the space and instilled with 5mL of sterile water.  The drain was placed  within the vagina and the area was again cleaned with sterile water and betadine.  The patient tolerated the procedure well. The patient was instructed on post-op care including medication usage and follow up.   Labs and Imaging: No results found.  Assessment & Plan:  18 year old Bartholin Cyst Incision and Drainage  -*** -*** -***   Total face-to-face time with patient: {Blank single:19197::15,25,30} minutes   Synthia Raisin, CNM 06/26/2024 9:03 AM

## 2024-06-28 DIAGNOSIS — N750 Cyst of Bartholin's gland: Secondary | ICD-10-CM | POA: Insufficient documentation

## 2024-06-28 MED ORDER — METRONIDAZOLE 500 MG PO TABS: 500.0000 mg | ORAL_TABLET | Freq: Two times a day (BID) | ORAL | 0 refills | Status: DC

## 2024-06-29 LAB — CERVICOVAGINAL ANCILLARY ONLY
Chlamydia: NEGATIVE
Comment: NEGATIVE
Comment: NEGATIVE
Comment: NORMAL
Neisseria Gonorrhea: NEGATIVE
Trichomonas: NEGATIVE

## 2024-07-27 ENCOUNTER — Ambulatory Visit: Admitting: Obstetrics & Gynecology

## 2024-08-21 ENCOUNTER — Ambulatory Visit: Admitting: Obstetrics and Gynecology

## 2024-08-21 ENCOUNTER — Other Ambulatory Visit (HOSPITAL_COMMUNITY)
Admission: RE | Admit: 2024-08-21 | Discharge: 2024-08-21 | Disposition: A | Source: Ambulatory Visit | Attending: Obstetrics and Gynecology | Admitting: Obstetrics and Gynecology

## 2024-08-21 VITALS — BP 104/60 | HR 58 | Ht 69.0 in | Wt 129.0 lb

## 2024-08-21 DIAGNOSIS — N750 Cyst of Bartholin's gland: Secondary | ICD-10-CM

## 2024-08-21 DIAGNOSIS — N898 Other specified noninflammatory disorders of vagina: Secondary | ICD-10-CM | POA: Diagnosis not present

## 2024-08-21 NOTE — Progress Notes (Signed)
° °  RETURN GYNECOLOGY VISIT  Subjective:  Rhonda Morris is a 18 y.o. with LMP 07/27/24 presenting for follow up of Bartholin's cyst  Saw Harlene Duncans 10/17 for bartholin's cyst and is s/p I&D with Word catheter placement.   Notes that it was in place earlier this week but now she can't feel/find it. No residual pain or swelling.   Dx with BV as well last appt. Took PO flagyl  but odor did not improve. Would like to re test today  Objective:   Vitals:   08/21/24 0904  BP: 104/60  Pulse: (!) 58  Weight: 129 lb (58.5 kg)  Height: 5' 9 (1.753 m)    General:  Alert, oriented and cooperative. Patient is in no acute distress.  Skin: Skin is warm and dry. No rash noted.   Cardiovascular: Normal heart rate noted  Respiratory: Normal respiratory effort, no problems with respiration noted  Abdomen: Soft, non-tender, non-distended   Pelvic: NEFG. Word catheter is not present. BME performed, no e/o retained catheter  Exam performed in the presence of a chaperone  Assessment and Plan:  Rhonda Morris is a 18 y.o. s/p word catheter placement for bartholin's, now resolved & well healed  Cyst of left Bartholin's gland Resolved  Vaginal discharge Discussed general vulvar care -     Cervicovaginal ancillary only( )  Return if symptoms worsen or fail to improve.  No future appointments.  Kieth JAYSON Carolin, MD

## 2024-08-21 NOTE — Patient Instructions (Signed)
 Avoid: - Synthetic underwear - Tight pants - Swim suits, thongs, leotards, leggings for prolonged periods of time - Scented soap/shampoo - Bubble baths - Scented detergents, dryer sheets - Baby wipes - Feminine sprays, douches, powders - Panty liners - Dyed toilet paper - Shaving  Trying swapping out the above for: - Cotton or no underwear - Loose pants, skirts, dresses - Changing out of swimwear, thongs, and workout gear as soon as you're done exercising - Fragrance free soaps (like Dove sensitive skin) - Warm plain water baths - Unscented laundry detergent - Use a bedet or peri bottle to rinse instead of baby wipes - Tampons, cotton pads, cotton period underwear - Undyed toilet paper - Clipping hair

## 2024-08-24 LAB — CERVICOVAGINAL ANCILLARY ONLY
Bacterial Vaginitis (gardnerella): POSITIVE — AB
Candida Glabrata: NEGATIVE
Candida Vaginitis: NEGATIVE
Comment: NEGATIVE
Comment: NEGATIVE
Comment: NEGATIVE

## 2024-08-25 ENCOUNTER — Other Ambulatory Visit: Payer: Self-pay

## 2024-08-25 MED ORDER — METRONIDAZOLE 500 MG PO TABS: 500.0000 mg | ORAL_TABLET | Freq: Two times a day (BID) | ORAL | 0 refills | Status: AC

## 2024-08-27 ENCOUNTER — Ambulatory Visit: Payer: Self-pay | Admitting: Obstetrics and Gynecology

## 2024-09-23 ENCOUNTER — Telehealth
